# Patient Record
Sex: Male | Born: 1965 | Race: White | Hispanic: No | Marital: Married | Smoking: Never smoker
Health system: Southern US, Community
[De-identification: ages and names within clinical notes are randomized; demographics above are authoritative.]

## PROBLEM LIST (undated history)

## (undated) HISTORY — PX: ABDOMINAL SURGERY: SHX537

---

## 2008-06-12 ENCOUNTER — Encounter: Payer: Self-pay | Admitting: Emergency Medicine

## 2008-06-13 ENCOUNTER — Inpatient Hospital Stay (HOSPITAL_COMMUNITY): Admission: EM | Admit: 2008-06-13 | Discharge: 2008-06-17 | Payer: Self-pay | Admitting: Internal Medicine

## 2010-05-04 ENCOUNTER — Emergency Department (HOSPITAL_BASED_OUTPATIENT_CLINIC_OR_DEPARTMENT_OTHER): Admission: EM | Admit: 2010-05-04 | Discharge: 2010-05-04 | Payer: Self-pay | Admitting: Emergency Medicine

## 2010-05-04 ENCOUNTER — Ambulatory Visit: Payer: Self-pay | Admitting: Diagnostic Radiology
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
## 2011-06-01 LAB — COMPREHENSIVE METABOLIC PANEL
ALT: 15
ALT: 17
ALT: 19
ALT: 23
ALT: 31
AST: 14
AST: 15
AST: 21
Albumin: 3.2 — ABNORMAL LOW
Alkaline Phosphatase: 68
Alkaline Phosphatase: 73
Alkaline Phosphatase: 79
BUN: 4 — ABNORMAL LOW
BUN: 5 — ABNORMAL LOW
BUN: 8
CO2: 28
CO2: 29
CO2: 29
Calcium: 10.4
Calcium: 8.3 — ABNORMAL LOW
Calcium: 8.9
Calcium: 8.9
Chloride: 105
Chloride: 106
Creatinine, Ser: 0.78
Creatinine, Ser: 0.81
Creatinine, Ser: 0.92
GFR calc Af Amer: >60
GFR calc Af Amer: >60
GFR calc Af Amer: >60
GFR calc non Af Amer: >60
GFR calc non Af Amer: >60
GFR calc non Af Amer: >60
Glucose, Bld: 109 — ABNORMAL HIGH
Glucose, Bld: 111 — ABNORMAL HIGH
Glucose, Bld: 116 — ABNORMAL HIGH
Potassium: 3.6
Potassium: 3.9
Potassium: 3.9
Sodium: 138
Sodium: 140
Sodium: 140
Sodium: 142
Total Bilirubin: 0.7
Total Bilirubin: 0.8
Total Bilirubin: 1.1
Total Protein: 6.2
Total Protein: 6.2
Total Protein: 6.3
Total Protein: 6.6
Total Protein: 6.6

## 2011-06-01 LAB — CBC
HCT: 41
HCT: 42.2
HCT: 43
Hemoglobin: 14.1
Hemoglobin: 14.3
Hemoglobin: 17.2 — ABNORMAL HIGH
MCHC: 33.2
MCHC: 33.5
MCHC: 33.6
MCHC: 33.7
MCV: 92.9
MCV: 95.8
MCV: 96.9
Platelets: 295
Platelets: 299
Platelets: 316
RBC: 4.29
RBC: 4.5
RBC: 5.53
RDW: 14.2
RDW: 14.3
RDW: 14.3
RDW: 14.3
RDW: 14.3
WBC: 11.7 — ABNORMAL HIGH

## 2011-06-01 LAB — URINALYSIS, ROUTINE W REFLEX MICROSCOPIC
Bilirubin Urine: NEGATIVE
Ketones, ur: >80 — AB
Nitrite: NEGATIVE
Protein, ur: NEGATIVE
Specific Gravity, Urine: 1.041 — ABNORMAL HIGH
Urobilinogen, UA: 0.2

## 2011-06-01 LAB — LIPASE, BLOOD: Lipase: 69

## 2011-06-01 LAB — MAGNESIUM: Magnesium: 2.2

## 2011-06-01 LAB — DIFFERENTIAL
Basophils Absolute: 0.2 — ABNORMAL HIGH
Eosinophils Absolute: 0.2
Eosinophils Relative: 1
Monocytes Absolute: 1.2 — ABNORMAL HIGH
Neutrophils Relative %: 68

## 2014-05-03 ENCOUNTER — Emergency Department (HOSPITAL_BASED_OUTPATIENT_CLINIC_OR_DEPARTMENT_OTHER)
Admission: EM | Admit: 2014-05-03 | Discharge: 2014-05-04 | Disposition: A | Discharge status: Other Acute Inpatient Hospital | Payer: Managed Care, Other (non HMO) | Attending: Emergency Medicine | Admitting: Emergency Medicine

## 2014-05-03 ENCOUNTER — Encounter (HOSPITAL_BASED_OUTPATIENT_CLINIC_OR_DEPARTMENT_OTHER): Payer: Self-pay | Admitting: Emergency Medicine

## 2014-05-03 ENCOUNTER — Emergency Department (HOSPITAL_BASED_OUTPATIENT_CLINIC_OR_DEPARTMENT_OTHER): Payer: Managed Care, Other (non HMO)

## 2014-05-03 DIAGNOSIS — Z9889 Other specified postprocedural states: Secondary | ICD-10-CM | POA: Insufficient documentation

## 2014-05-03 DIAGNOSIS — K56609 Unspecified intestinal obstruction, unspecified as to partial versus complete obstruction: Secondary | ICD-10-CM | POA: Insufficient documentation

## 2014-05-03 DIAGNOSIS — R109 Unspecified abdominal pain: Secondary | ICD-10-CM | POA: Diagnosis present

## 2014-05-03 LAB — COMPREHENSIVE METABOLIC PANEL
ALK PHOS: 77 U/L (ref 39–117)
ALT: 45 U/L (ref 0–53)
AST: 31 U/L (ref 0–37)
Albumin: 4.9 g/dL (ref 3.5–5.2)
Anion gap: 16 — ABNORMAL HIGH (ref 5–15)
BILIRUBIN TOTAL: 0.5 mg/dL (ref 0.3–1.2)
BUN: 14 mg/dL (ref 6–23)
CHLORIDE: 99 meq/L (ref 96–112)
CO2: 27 meq/L (ref 19–32)
Calcium: 10.7 mg/dL — ABNORMAL HIGH (ref 8.4–10.5)
Creatinine, Ser: 0.8 mg/dL (ref 0.50–1.35)
GLUCOSE: 104 mg/dL — AB (ref 70–99)
POTASSIUM: 4.9 meq/L (ref 3.7–5.3)
SODIUM: 142 meq/L (ref 137–147)
TOTAL PROTEIN: 8.3 g/dL (ref 6.0–8.3)

## 2014-05-03 LAB — URINALYSIS, ROUTINE W REFLEX MICROSCOPIC
Bilirubin Urine: NEGATIVE
Glucose, UA: NEGATIVE mg/dL
Hgb urine dipstick: NEGATIVE
Ketones, ur: 15 mg/dL — AB
Leukocytes, UA: NEGATIVE
Nitrite: NEGATIVE
Protein, ur: NEGATIVE mg/dL
Specific Gravity, Urine: 1.024 (ref 1.005–1.030)
Urobilinogen, UA: 0.2 mg/dL (ref 0.0–1.0)
pH: 7 (ref 5.0–8.0)

## 2014-05-03 LAB — CBC WITH DIFFERENTIAL/PLATELET
Basophils Absolute: 0 K/uL (ref 0.0–0.1)
Basophils Relative: 0 % (ref 0–1)
Eosinophils Absolute: 0.4 K/uL (ref 0.0–0.7)
Eosinophils Relative: 2 % (ref 0–5)
HCT: 48.5 % (ref 39.0–52.0)
Hemoglobin: 16.7 g/dL (ref 13.0–17.0)
Lymphocytes Relative: 23 % (ref 12–46)
Lymphs Abs: 5.1 K/uL — ABNORMAL HIGH (ref 0.7–4.0)
MCH: 32.6 pg (ref 26.0–34.0)
MCHC: 34.4 g/dL (ref 30.0–36.0)
MCV: 94.5 fL (ref 78.0–100.0)
Monocytes Absolute: 2 K/uL — ABNORMAL HIGH (ref 0.1–1.0)
Monocytes Relative: 9 % (ref 3–12)
Neutro Abs: 14.6 K/uL — ABNORMAL HIGH (ref 1.7–7.7)
Neutrophils Relative %: 66 % (ref 43–77)
Platelets: 375 K/uL (ref 150–400)
RBC: 5.13 MIL/uL (ref 4.22–5.81)
RDW: 14.1 % (ref 11.5–15.5)
WBC: 22.1 K/uL — ABNORMAL HIGH (ref 4.0–10.5)

## 2014-05-03 LAB — LIPASE, BLOOD: Lipase: 36 U/L (ref 11–59)

## 2014-05-03 MED ORDER — ONDANSETRON HCL 4 MG/2ML IJ SOLN
4.0000 mg | Freq: Once | INTRAMUSCULAR | Status: AC
  Administered 2014-05-03: 4 mg via INTRAVENOUS
  Filled 2014-05-03: qty 2

## 2014-05-03 MED ORDER — HYDROMORPHONE HCL PF 1 MG/ML IJ SOLN
0.5000 mg | Freq: Once | INTRAMUSCULAR | Status: AC
  Administered 2014-05-03: 1 mg via INTRAVENOUS
  Filled 2014-05-03: qty 1

## 2014-05-03 MED ORDER — IOHEXOL 300 MG/ML  SOLN
100.0000 mL | Freq: Once | INTRAMUSCULAR | Status: AC | PRN
  Administered 2014-05-03: 100 mL via INTRAVENOUS

## 2014-05-03 MED ORDER — HYDROMORPHONE HCL PF 1 MG/ML IJ SOLN
1.0000 mg | Freq: Once | INTRAMUSCULAR | Status: AC
  Administered 2014-05-03: 1 mg via INTRAVENOUS
  Filled 2014-05-03: qty 1

## 2014-05-03 MED ORDER — HYDROMORPHONE HCL PF 1 MG/ML IJ SOLN
1.0000 mg | Freq: Once | INTRAMUSCULAR | Status: AC
  Administered 2014-05-03: 1 mg via INTRAVENOUS

## 2014-05-03 MED ORDER — LORAZEPAM 2 MG/ML IJ SOLN
0.5000 mg | Freq: Once | INTRAMUSCULAR | Status: AC
  Administered 2014-05-03: 0.5 mg via INTRAVENOUS
  Filled 2014-05-03: qty 1

## 2014-05-03 MED ORDER — SODIUM CHLORIDE 0.9 % IV SOLN
Freq: Once | INTRAVENOUS | Status: AC
  Administered 2014-05-03: 20:00:00 via INTRAVENOUS

## 2014-05-03 MED ORDER — PROMETHAZINE HCL 25 MG/ML IJ SOLN
12.5000 mg | Freq: Once | INTRAMUSCULAR | Status: AC
  Administered 2014-05-03: 12.5 mg via INTRAVENOUS
  Filled 2014-05-03: qty 1

## 2014-05-03 MED ORDER — HYDROMORPHONE HCL PF 1 MG/ML IJ SOLN
INTRAMUSCULAR | Status: AC
  Filled 2014-05-03: qty 1

## 2014-05-03 NOTE — ED Notes (Signed)
Sharp pain 2 hrs ago, left to right. Hx of SBO 5 years ago. Nausea with no vomiting.

## 2014-05-03 NOTE — ED Notes (Signed)
Discussed with Pa  About NG tube. Going to wait until after patient drinks the contrast for CT to place the tube. Pain and nausea med ordered until that point.

## 2014-05-03 NOTE — ED Provider Notes (Signed)
Medical screening examination/treatment/procedure(s) were conducted as a shared visit with non-physician practitioner(s) and myself.  I personally evaluated the patient during the encounter.  Recurrent SBO Sxs; last BM today, moderate diffuse tenderness without rebound. 2240   Hurman Horn, MD 05/04/14 1224

## 2014-05-03 NOTE — ED Provider Notes (Signed)
CSN: [id number]     Arrival date & time 05/03/14  1849 History  This chart was scribed for non-physician practitioner Elpidio Anis working with Hurman Horn, MD by Carl Best, ED Scribe. This patient was seen in room MH04/MH04 and the patient's care was started at 7:28 PM.     Chief Complaint  Patient presents with  . Abdominal Pain   Patient is a 48 y.o. male presenting with abdominal pain. The history is provided by the patient. No language interpreter was used.  Abdominal Pain Associated symptoms: nausea   Associated symptoms: no chest pain, no cough, no fever, no shortness of breath and no vomiting    HPI Comments: Joseph Black is a 48 y.o. male who presents to the Emergency Department complaining of constant, sharp abdominal pain with associated nausea that started 2 hours ago.  He denies back pain and vomiting as associated symptoms.  His last bowel movement was this morning.  He has a history of SBO five years ago and states that the pain he is experiencing now is similar.  His last colonoscopy was in 2005.   History reviewed. No pertinent past medical history. Past Surgical History  Procedure Laterality Date  . Abdominal surgery     No family history on file. History  Substance Use Topics  . Smoking status: Never Smoker   . Smokeless tobacco: Not on file  . Alcohol Use: No    Review of Systems  Constitutional: Negative.  Negative for fever.  HENT: Negative.   Respiratory: Negative.  Negative for cough and shortness of breath.   Cardiovascular: Negative.  Negative for chest pain.  Gastrointestinal: Positive for nausea and abdominal pain. Negative for vomiting.  Genitourinary: Negative.   Musculoskeletal: Negative.   Skin: Negative.   Neurological: Negative.   All other systems reviewed and are negative.     Allergies  Review of patient's allergies indicates no known allergies.  Home Medications   Prior to Admission medications   Not on File   BP 137/83   Pulse 84  Temp(Src) 97.9 F (36.6 C)  Resp 22  Ht 6' (1.829 m)  Wt 172 lb (78.019 kg)  BMI 23.32 kg/m2  SpO2 100%  Physical Exam  Constitutional: He is oriented to person, place, and time. He appears well-developed and well-nourished.  HENT:  Head: Normocephalic.  Neck: Normal range of motion. Neck supple.  Cardiovascular: Normal rate and regular rhythm.   Pulmonary/Chest: Effort normal and breath sounds normal.  Abdominal: Soft. He exhibits distension. He exhibits no mass. There is tenderness. There is no rebound and no guarding.  Diffuse tenderness greatest in the left abdomen. No mass. BS absent.  Musculoskeletal: Normal range of motion.  Neurological: He is alert and oriented to person, place, and time.  Skin: Skin is warm and dry. No rash noted.  Psychiatric: He has a normal mood and affect.    ED Course  Procedures (including critical care time) Labs Review Labs Reviewed - No data to display Results for orders placed during the hospital encounter of 05/03/14  CBC WITH DIFFERENTIAL      Result Value Ref Range   WBC 22.1 (*) 4.0 - 10.5 K/uL   RBC 5.13  4.22 - 5.81 MIL/uL   Hemoglobin 16.7  13.0 - 17.0 g/dL   HCT 40.9  81.1 - 91.4 %   MCV 94.5  78.0 - 100.0 fL   MCH 32.6  26.0 - 34.0 pg   MCHC 34.4  30.0 - 36.0  g/dL   RDW 16.1  09.6 - 04.5 %   Platelets 375  150 - 400 K/uL   Neutrophils Relative % 66  43 - 77 %   Lymphocytes Relative 23  12 - 46 %   Monocytes Relative 9  3 - 12 %   Eosinophils Relative 2  0 - 5 %   Basophils Relative 0  0 - 1 %   Neutro Abs 14.6 (*) 1.7 - 7.7 K/uL   Lymphs Abs 5.1 (*) 0.7 - 4.0 K/uL   Monocytes Absolute 2.0 (*) 0.1 - 1.0 K/uL   Eosinophils Absolute 0.4  0.0 - 0.7 K/uL   Basophils Absolute 0.0  0.0 - 0.1 K/uL  COMPREHENSIVE METABOLIC PANEL      Result Value Ref Range   Sodium 142  137 - 147 mEq/L   Potassium 4.9  3.7 - 5.3 mEq/L   Chloride 99  96 - 112 mEq/L   CO2 27  19 - 32 mEq/L   Glucose, Bld 104 (*) 70 - 99 mg/dL   BUN  14  6 - 23 mg/dL   Creatinine, Ser 4.09  0.50 - 1.35 mg/dL   Calcium 81.1 (*) 8.4 - 10.5 mg/dL   Total Protein 8.3  6.0 - 8.3 g/dL   Albumin 4.9  3.5 - 5.2 g/dL   AST 31  0 - 37 U/L   ALT 45  0 - 53 U/L   Alkaline Phosphatase 77  39 - 117 U/L   Total Bilirubin 0.5  0.3 - 1.2 mg/dL   GFR calc non Af Amer >90  >90 mL/min   GFR calc Af Amer >90  >90 mL/min   Anion gap 16 (*) 5 - 15  LIPASE, BLOOD      Result Value Ref Range   Lipase 36  11 - 59 U/L  URINALYSIS, ROUTINE W REFLEX MICROSCOPIC      Result Value Ref Range   Color, Urine YELLOW  YELLOW   APPearance CLEAR  CLEAR   Specific Gravity, Urine 1.024  1.005 - 1.030   pH 7.0  5.0 - 8.0   Glucose, UA NEGATIVE  NEGATIVE mg/dL   Hgb urine dipstick NEGATIVE  NEGATIVE   Bilirubin Urine NEGATIVE  NEGATIVE   Ketones, ur 15 (*) NEGATIVE mg/dL   Protein, ur NEGATIVE  NEGATIVE mg/dL   Urobilinogen, UA 0.2  0.0 - 1.0 mg/dL   Nitrite NEGATIVE  NEGATIVE   Leukocytes, UA NEGATIVE  NEGATIVE   Ct Abdomen Pelvis W Contrast  05/03/2014   CLINICAL DATA:  Sharp abdominal pain, history of small bowel obstruction.  EXAM: CT ABDOMEN AND PELVIS WITH CONTRAST  TECHNIQUE: Multidetector CT imaging of the abdomen and pelvis was performed using the standard protocol following bolus administration of intravenous contrast.  CONTRAST:  OMNIPAQUE IOHEXOL 300 MG/ML  SOLN  COMPARISON:  Acute abdominal series May 03, 2014 and CT of the abdomen and pelvis June 12, 2008  FINDINGS: LUNG BASES: Included view of the lung bases are clear. The visualized heart and pericardium are unremarkable. Reflux of contrast into the included the esophagus.  KIDNEYS/BLADDER: Kidneys are orthotopic, demonstrating normal size and morphology. No nephrolithiasis, hydronephrosis or renal masses. Too small to characterize hypodensities in the kidneys bilaterally. Delayed phase demonstrates prompt symmetric excretion of contrast into the proximal urinary collecting system. The  unopacified ureters are normal in course and caliber. Urinary bladder is partially distended and unremarkable.  SOLID ORGANS: The liver, spleen, gallbladder, pancreas and adrenal glands are  unremarkable.  GASTROINTESTINAL TRACT: Distended small bowel in the left upper quadrant measure up to 3.7 cm with small bowel feces. Transition point in the left lower quadrant associated with mild small bowel wall thickening, sagittal 84/113. The stomach, large bowel are normal in course and caliber without inflammatory changes, the sensitivity may be decreased by lack of enteric contrast. Mild amount of retained large bowel stool. Normal appendix.  PERITONEUM/RETROPERITONEUM: 13 mm nodular density along the left hemidiaphragm deforms lead via the gastric cardia (coronal 65/95), which appear relatively stable from prior imaging, considering chronicity is likely benign. Trace free fluid fluid, no free air. Aortoiliac vessels are normal in course and caliber, trace calcific atherosclerosis. No lymphadenopathy by CT size criteria. Similar small central mesentery lymph nodes. Internal reproductive organs are unremarkable.  SOFT TISSUES/ OSSEOUS STRUCTURES: Mild rectus abdominus diastases with small fat containing umbilical hernia. Multiple tiny a fat containing ventral abdominal hernias.  IMPRESSION: Recurrent early versus partial small bowel obstruction, transition point in left lower quadrant, possibly due to adhesions though there is associated focal small bowel wall thickening. No perforation. Trace free fluid is likely reactive.  Similar small central mesenteric lymph nodes may be reactive.   Electronically Signed   By: Awilda Metro   On: 05/03/2014 22:34   Dg Abd Acute W/chest  05/03/2014   CLINICAL DATA:  Lambert Mody left lower quadrant pain, history of small bowel obstruction  EXAM: ACUTE ABDOMEN SERIES (ABDOMEN 2 VIEW & CHEST 1 VIEW)  COMPARISON:  None.  FINDINGS: Lungs are essentially clear. No focal consolidation. No  pleural effusion or pneumothorax.  The heart is normal size.  Dilated loops of fluid-filled small bowel in the left mid abdomen, worrisome for early/ partial small bowel obstruction. Colon is not decompressed.  No evidence of free air under the diaphragm on the upright view.  IMPRESSION: Dilated loops of fluid cell small bowel in the left mid abdomen, worrisome for early/partial small bowel obstruction.  Consider CT abdomen pelvis for further evaluation (preferably with oral and IV contrast as tolerated).  No evidence of acute cardiopulmonary disease.   Electronically Signed   By: Charline Bills M.D.   On: 05/03/2014 20:36   Imaging Review No results found.   EKG Interpretation None      MDM   Final diagnoses:  None    1. Early vs partial SBO  Presents with symptoms familiar to him as previous SBO. No surgical intervention required on last episode, 2009. Plan to admit to [facility] at patient's request..  The patient has remained stable throughout ED visit. Pain managed with medications. VSS, labs essentially normal with exception of leukocytosis of 22K. Discussed with Dr. Pearson Grippe who requests consultation with the surgeon. Dr. Claudine Mouton advises they will consult on patient in the morning and requests hospitalist to admit.   I personally performed the services described in this documentation, which was scribed in my presence. The recorded information has been reviewed and is accurate.     Arnoldo Hooker, PA-C 05/03/14 2310

## 2015-03-17 IMAGING — CT CT ABD-PELV W/ CM
2 of 5 series · 15 of 46 positions shown, 17 images · IV contrast (APPLIED)
Comparison: Acute abdominal series May 03, 2014 and CT of the
abdomen and pelvis June 12, 2008

CLINICAL DATA: Sharp abdominal pain, history of small bowel
obstruction.

EXAM:
CT ABDOMEN AND PELVIS WITH CONTRAST
TECHNIQUE: Multidetector CT imaging of the abdomen and pelvis was performed
using the standard protocol following bolus administration of
intravenous contrast.
CONTRAST:  100mL OMNIPAQUE IOHEXOL 300 MG/ML  SOLN

[Series 2: abd/pelvis 5.0 b31f · axial · 0.77mm/px · z∈[-485,-15]mm · 12 of 106 slices shown, 14 images]
[im 6/106  soft-tissue]
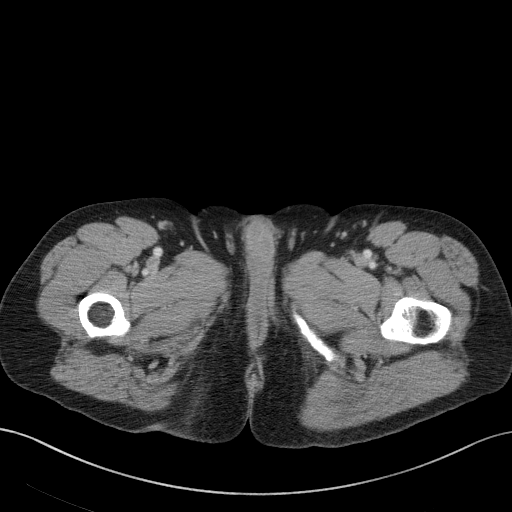
[im 6/106  bone]
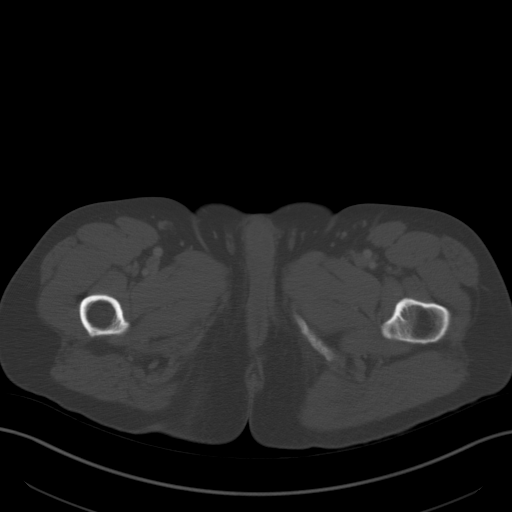
[im 16/106  soft-tissue]
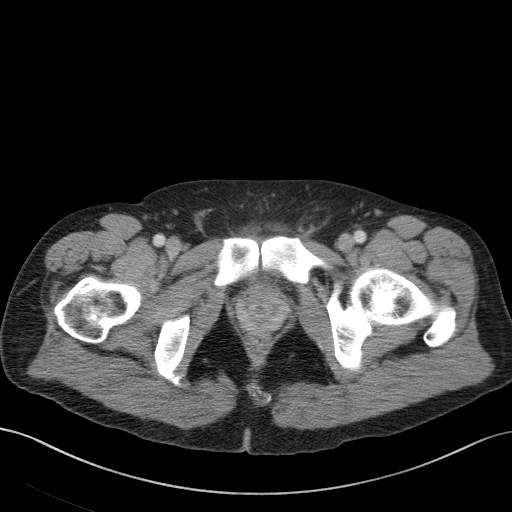
[im 22/106  soft-tissue]
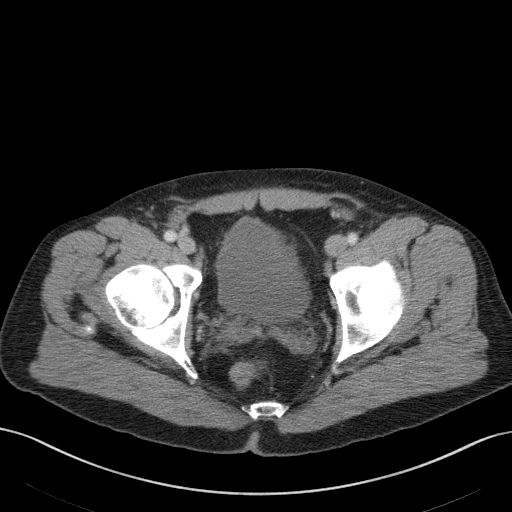
[im 32/106  soft-tissue]
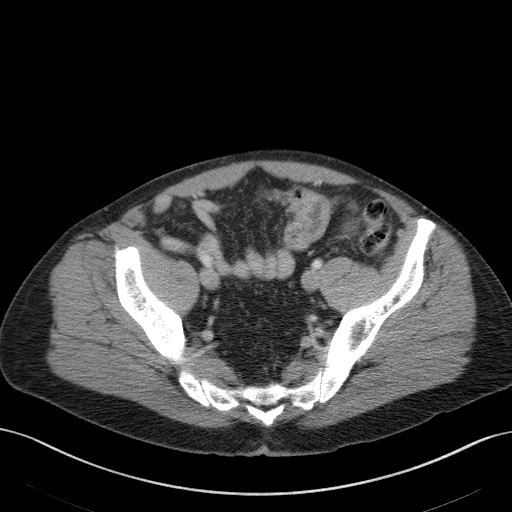
[im 43/106  soft-tissue]
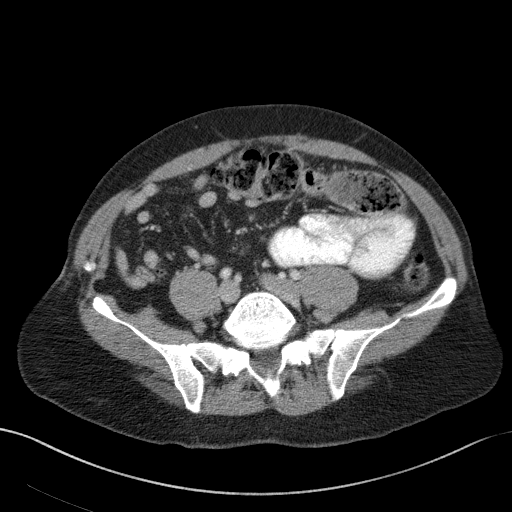
[im 48/106  soft-tissue]
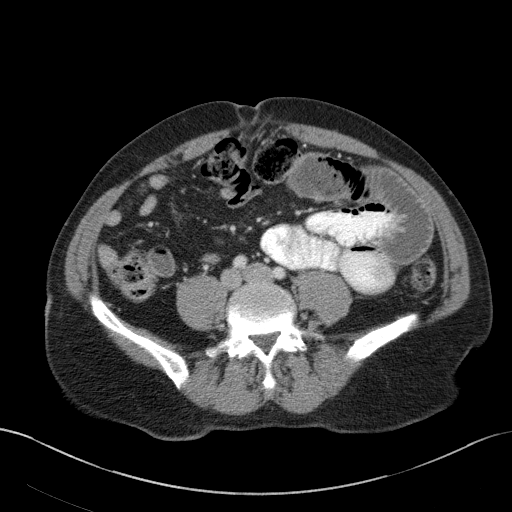
[im 58/106  soft-tissue]
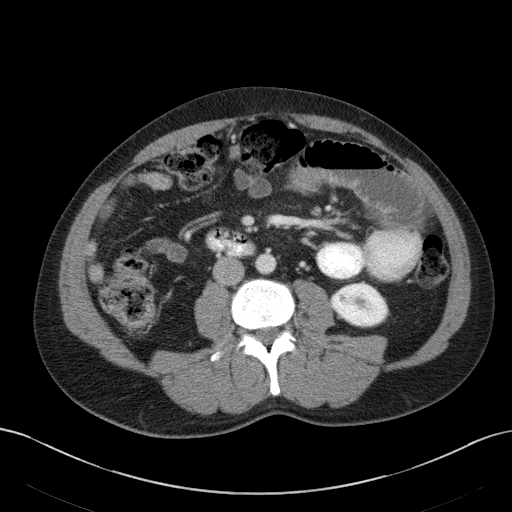
[im 64/106  soft-tissue]
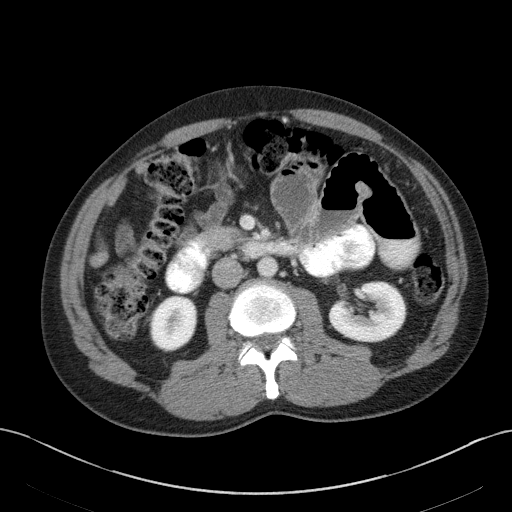
[im 74/106  soft-tissue]
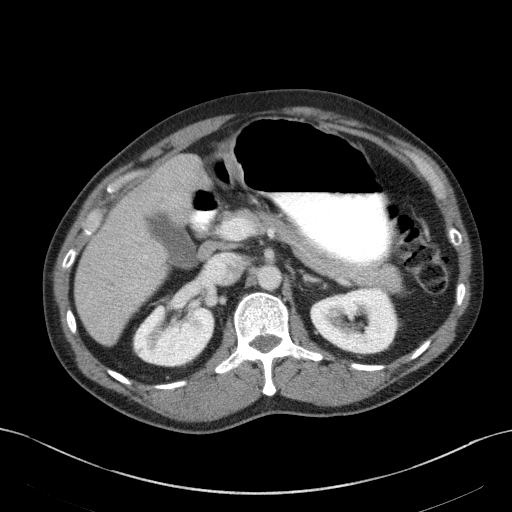
[im 74/106  bone]
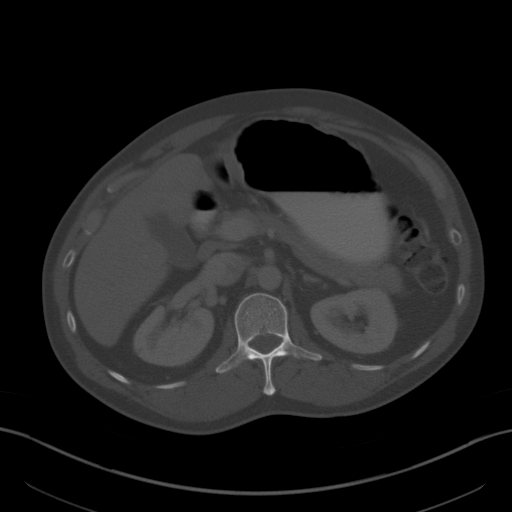
[im 85/106  soft-tissue]
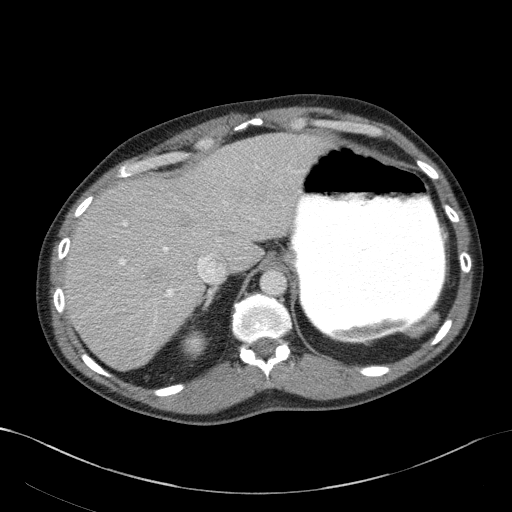
[im 90/106  soft-tissue]
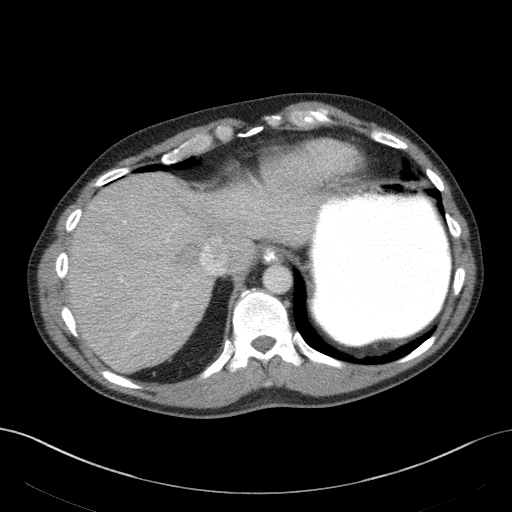
[im 100/106  soft-tissue]
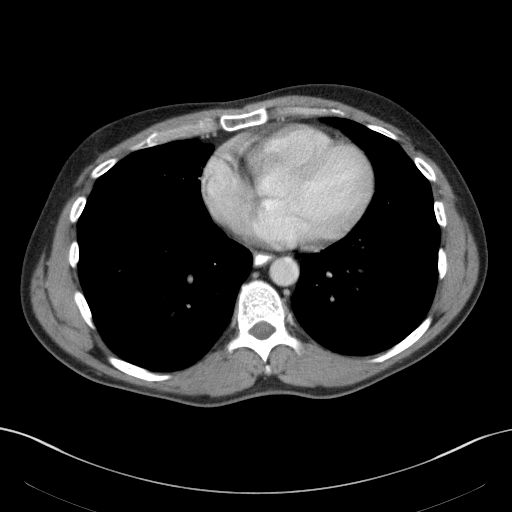

[Series 5: abd/pelvis 3.0 coronal · coronal · 0.94mm/px · 3 of 95 slices shown]
[im 32/95  soft-tissue]
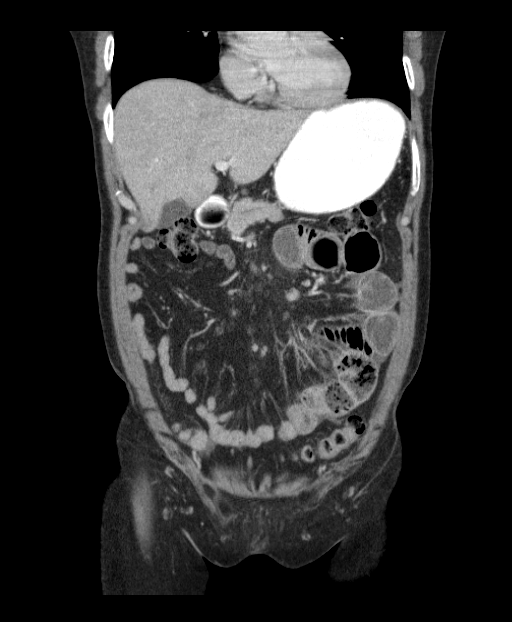
[im 42/95  soft-tissue]
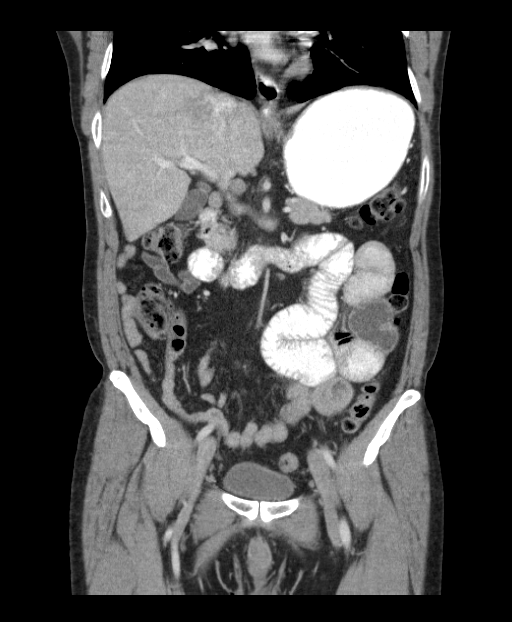
[im 53/95  soft-tissue]
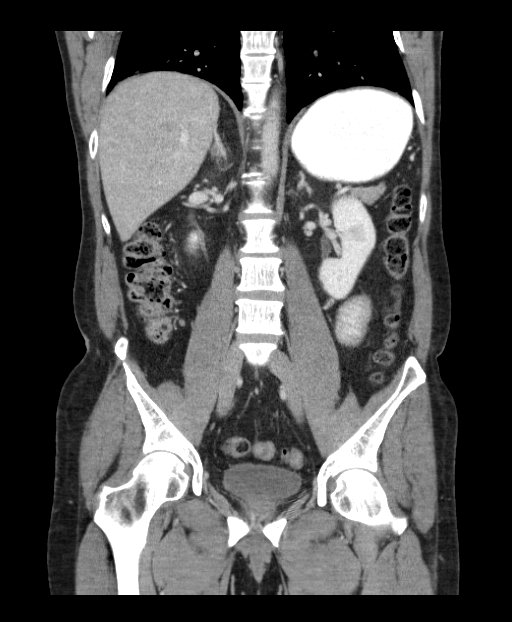

[15 of 46 positions shown; findings below may reference images not displayed]

FINDINGS: LUNG BASES: Included view of the lung bases are clear. The
visualized heart and pericardium are unremarkable. Reflux of
contrast into the included the esophagus.

KIDNEYS/BLADDER: Kidneys are orthotopic, demonstrating normal size
and morphology. No nephrolithiasis, hydronephrosis or renal masses.
Too small to characterize hypodensities in the kidneys bilaterally.
Delayed phase demonstrates prompt symmetric excretion of contrast
into the proximal urinary collecting system. The unopacified ureters
are normal in course and caliber. Urinary bladder is partially
distended and unremarkable.

SOLID ORGANS: The liver, spleen, gallbladder, pancreas and adrenal
glands are unremarkable.

GASTROINTESTINAL TRACT: Distended small bowel in the left upper
quadrant measure up to 3.7 cm with small bowel feces. Transition
point in the left lower quadrant associated with mild small bowel
wall thickening, sagittal 84/113. The stomach, large bowel are
normal in course and caliber without inflammatory changes, the
sensitivity may be decreased by lack of enteric contrast. Mild
amount of retained large bowel stool. Normal appendix.

PERITONEUM/RETROPERITONEUM: 13 mm nodular density along the left
hemidiaphragm deforms lead via the gastric cardia (coronal 65/95),
which appear relatively stable from prior imaging, considering
chronicity is likely benign. Trace free fluid fluid, no free air.
Aortoiliac vessels are normal in course and caliber, trace calcific
atherosclerosis. No lymphadenopathy by CT size criteria. Similar
small central mesentery lymph nodes. Internal reproductive organs
are unremarkable.

SOFT TISSUES/ OSSEOUS STRUCTURES: Mild rectus abdominus diastases
with small fat containing umbilical hernia. Multiple tiny a fat
containing ventral abdominal hernias.
IMPRESSION: Recurrent early versus partial small bowel obstruction, transition
point in left lower quadrant, possibly due to adhesions though there
is associated focal small bowel wall thickening. No perforation.
Trace free fluid is likely reactive.

Similar small central mesenteric lymph nodes may be reactive.

  By: Yosrri Arabi
